# Patient Record
Sex: Male | Born: 1955 | Hispanic: No | Marital: Married | Smoking: Former smoker
Health system: Southern US, Community
[De-identification: ages and names within clinical notes are randomized; demographics above are authoritative.]

## PROBLEM LIST (undated history)

## (undated) DIAGNOSIS — L719 Rosacea, unspecified: Secondary | ICD-10-CM

## (undated) DIAGNOSIS — R011 Cardiac murmur, unspecified: Secondary | ICD-10-CM

## (undated) DIAGNOSIS — Z860100 Personal history of colon polyps, unspecified: Secondary | ICD-10-CM

## (undated) DIAGNOSIS — T7840XA Allergy, unspecified, initial encounter: Secondary | ICD-10-CM

## (undated) DIAGNOSIS — H3322 Serous retinal detachment, left eye: Secondary | ICD-10-CM

## (undated) HISTORY — DX: Serous retinal detachment, left eye: H33.22

## (undated) HISTORY — DX: Cardiac murmur, unspecified: R01.1

## (undated) HISTORY — DX: Personal history of colon polyps, unspecified: Z86.0100

## (undated) HISTORY — DX: Rosacea, unspecified: L71.9

## (undated) HISTORY — DX: Allergy, unspecified, initial encounter: T78.40XA

## (undated) HISTORY — PX: COLONOSCOPY: SHX174

## (undated) HISTORY — DX: Personal history of colonic polyps: Z86.010

---
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
## 1999-01-11 ENCOUNTER — Encounter: Payer: Self-pay | Admitting: Emergency Medicine

## 1999-01-11 ENCOUNTER — Emergency Department (HOSPITAL_COMMUNITY): Admission: EM | Admit: 1999-01-11 | Discharge: 1999-01-11 | Payer: Self-pay | Admitting: Emergency Medicine

## 2004-08-19 ENCOUNTER — Ambulatory Visit: Payer: Self-pay | Admitting: Family Medicine

## 2004-08-22 ENCOUNTER — Ambulatory Visit: Payer: Self-pay | Admitting: Family Medicine

## 2004-09-13 ENCOUNTER — Ambulatory Visit: Payer: Self-pay | Admitting: Gastroenterology

## 2004-09-24 ENCOUNTER — Ambulatory Visit: Payer: Self-pay | Admitting: Gastroenterology

## 2004-09-24 LAB — HM COLONOSCOPY

## 2005-03-04 ENCOUNTER — Encounter: Admission: RE | Admit: 2005-03-04 | Discharge: 2005-03-04 | Payer: Self-pay | Admitting: Family Medicine

## 2005-03-04 ENCOUNTER — Ambulatory Visit: Payer: Self-pay | Admitting: Family Medicine

## 2006-07-18 IMAGING — CR DG OS CALCIS 2+V*L*
3 series · 3 of 3 positions shown · non-contrast
Comparison: None.

CLINICAL DATA: Pain, worst in the morning. 
 CALCANEUS - 2 VIEW:

[view not recorded (1 of 3)]
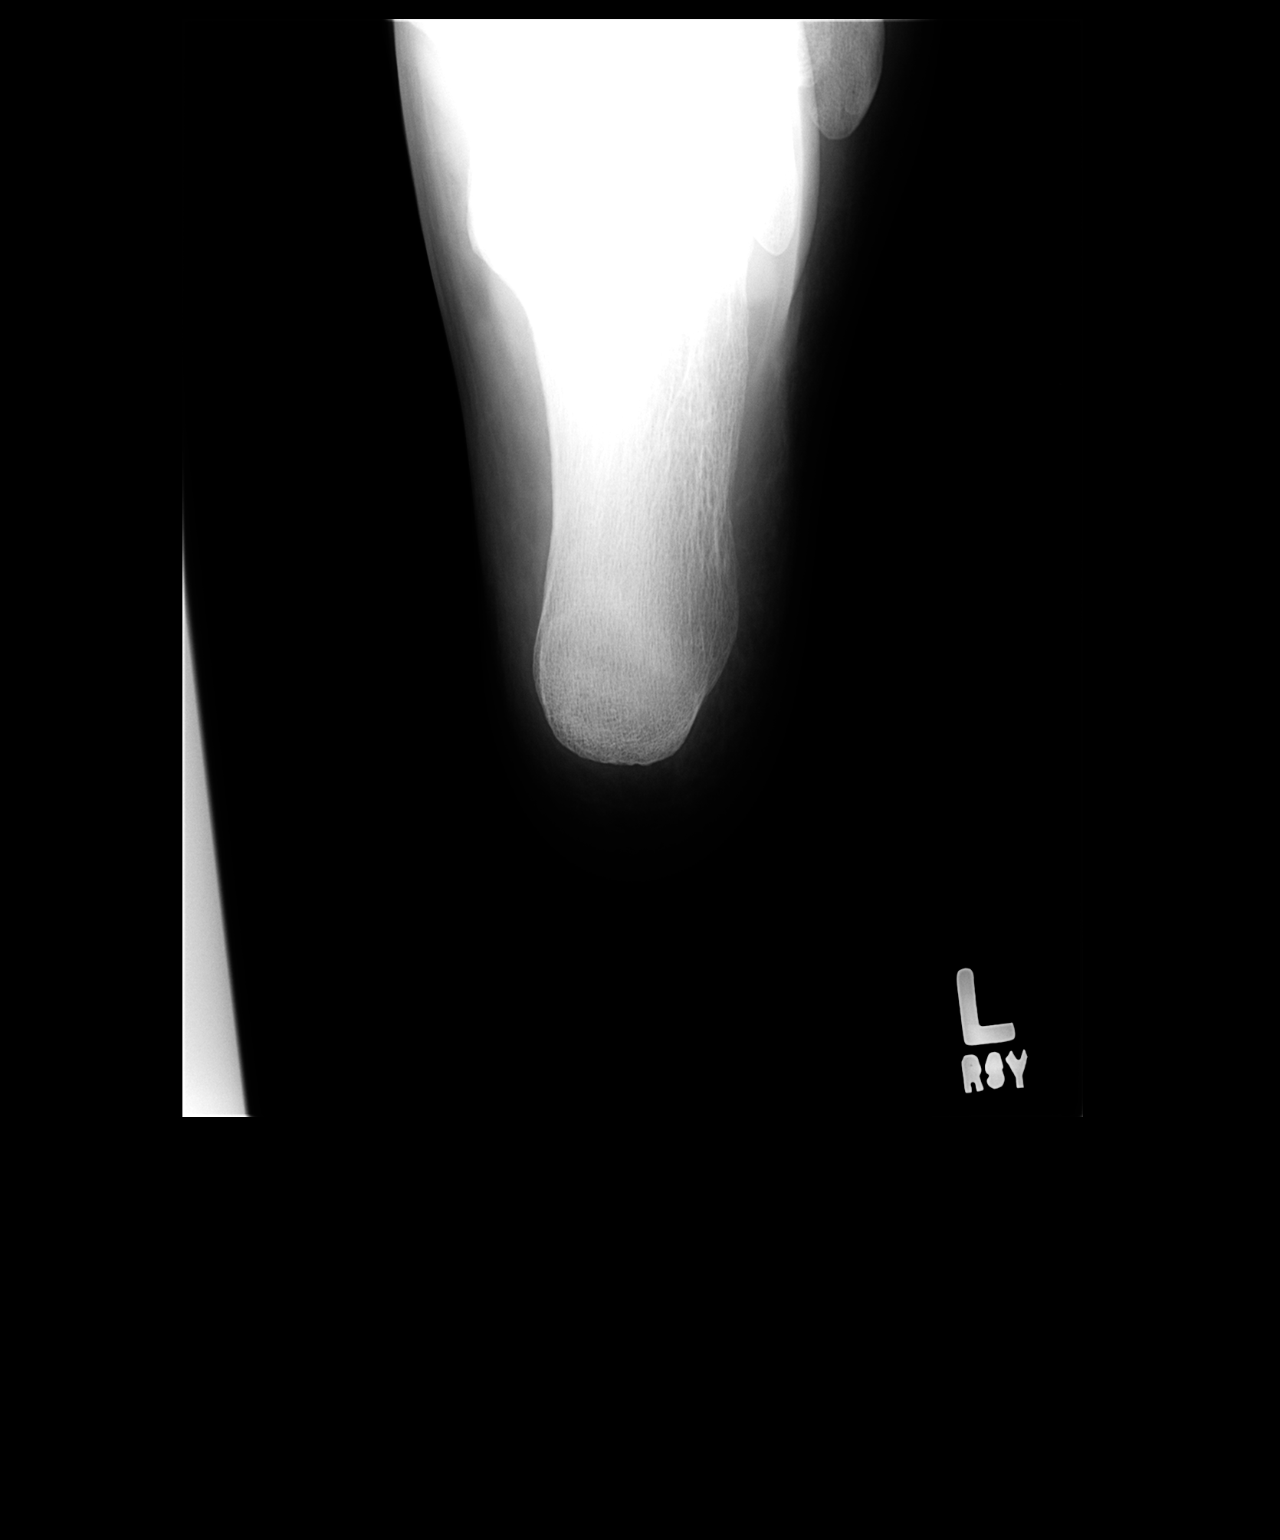

[view not recorded (2 of 3)]
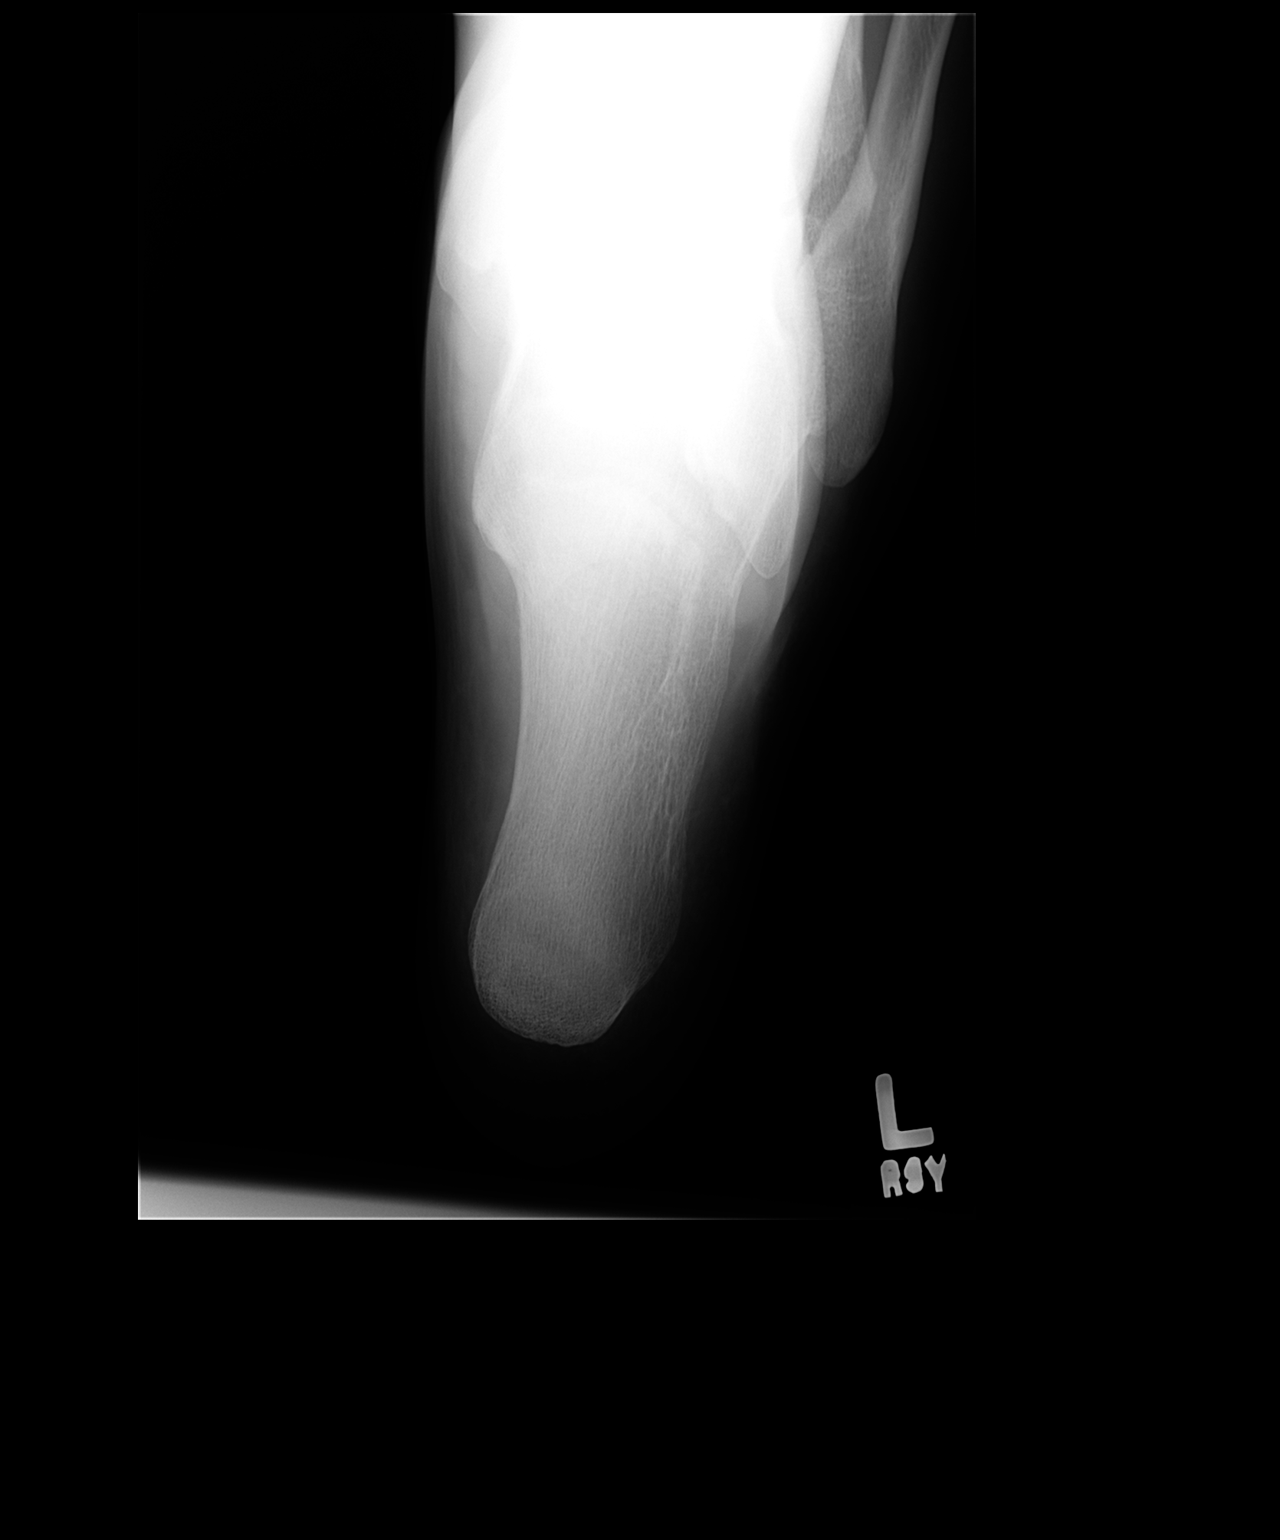

[view not recorded (3 of 3)]
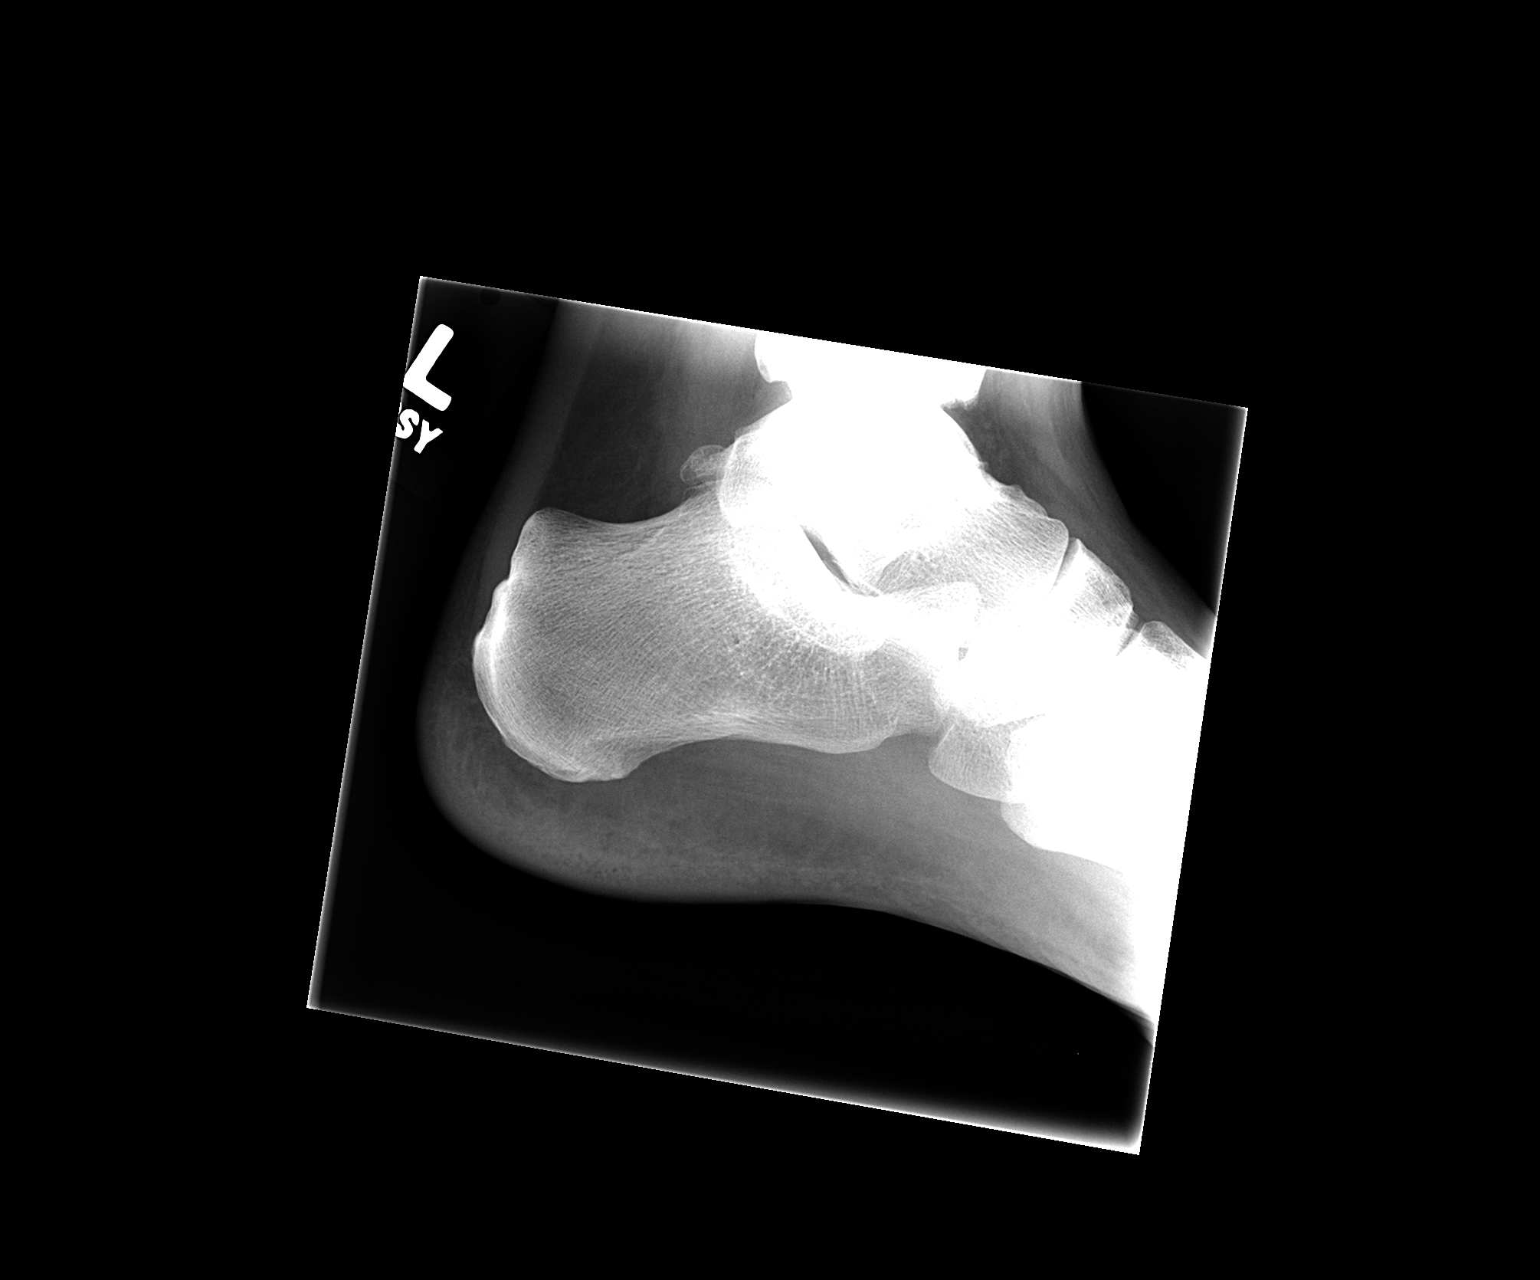

[3 of 3 positions shown; findings below may reference images not displayed]

FINDINGS: No fractures or dislocations are identified.  There are no plantar or calcaneal heel spurs.
IMPRESSION: Normal calcaneus.

## 2007-02-15 ENCOUNTER — Encounter: Payer: Self-pay | Admitting: Family Medicine

## 2007-02-15 DIAGNOSIS — J309 Allergic rhinitis, unspecified: Secondary | ICD-10-CM | POA: Insufficient documentation

## 2007-02-16 ENCOUNTER — Ambulatory Visit: Payer: Self-pay | Admitting: Family Medicine

## 2007-05-14 ENCOUNTER — Encounter: Payer: Self-pay | Admitting: Family Medicine

## 2008-04-13 ENCOUNTER — Ambulatory Visit: Payer: Self-pay | Admitting: Family Medicine

## 2009-05-02 ENCOUNTER — Ambulatory Visit: Payer: Self-pay | Admitting: Family Medicine

## 2009-07-14 LAB — HM COLONOSCOPY

## 2009-09-24 LAB — HM COLONOSCOPY

## 2009-11-15 ENCOUNTER — Encounter: Payer: Self-pay | Admitting: Family Medicine

## 2010-04-23 ENCOUNTER — Ambulatory Visit: Payer: Self-pay | Admitting: Family Medicine

## 2010-04-23 DIAGNOSIS — L2089 Other atopic dermatitis: Secondary | ICD-10-CM | POA: Insufficient documentation

## 2010-04-23 DIAGNOSIS — E663 Overweight: Secondary | ICD-10-CM | POA: Insufficient documentation

## 2010-04-23 DIAGNOSIS — L719 Rosacea, unspecified: Secondary | ICD-10-CM | POA: Insufficient documentation

## 2010-04-23 DIAGNOSIS — L708 Other acne: Secondary | ICD-10-CM | POA: Insufficient documentation

## 2010-04-24 LAB — CONVERTED CEMR LAB
ALT: 30 units/L (ref 0–53)
AST: 22 units/L (ref 0–37)
Albumin: 3.8 g/dL (ref 3.5–5.2)
Alkaline Phosphatase: 81 units/L (ref 39–117)
BUN: 12 mg/dL (ref 6–23)
Basophils Absolute: 0 10*3/uL (ref 0.0–0.1)
Basophils Relative: 0.5 % (ref 0.0–3.0)
Bilirubin, Direct: 0.1 mg/dL (ref 0.0–0.3)
CO2: 30 meq/L (ref 19–32)
Calcium: 9.4 mg/dL (ref 8.4–10.5)
Chloride: 102 meq/L (ref 96–112)
Cholesterol: 148 mg/dL (ref 0–200)
Creatinine, Ser: 0.8 mg/dL (ref 0.4–1.5)
Eosinophils Absolute: 0.2 10*3/uL (ref 0.0–0.7)
Eosinophils Relative: 1.9 % (ref 0.0–5.0)
GFR calc non Af Amer: 103.2 mL/min (ref >60)
Glucose, Bld: 90 mg/dL (ref 70–99)
HCT: 44.2 % (ref 39.0–52.0)
HDL: 38.8 mg/dL — ABNORMAL LOW (ref >39.00)
Hemoglobin: 15.1 g/dL (ref 13.0–17.0)
LDL Cholesterol: 79 mg/dL (ref 0–99)
Lymphocytes Relative: 24.1 % (ref 12.0–46.0)
Lymphs Abs: 2 10*3/uL (ref 0.7–4.0)
MCHC: 34.1 g/dL (ref 30.0–36.0)
MCV: 95.3 fL (ref 78.0–100.0)
Monocytes Absolute: 0.6 10*3/uL (ref 0.1–1.0)
Monocytes Relative: 6.9 % (ref 3.0–12.0)
Neutro Abs: 5.6 10*3/uL (ref 1.4–7.7)
Neutrophils Relative %: 66.6 % (ref 43.0–77.0)
PSA: 0.6 ng/mL (ref 0.10–4.00)
Phosphorus: 3.3 mg/dL (ref 2.3–4.6)
Platelets: 233 10*3/uL (ref 150.0–400.0)
Potassium: 4.2 meq/L (ref 3.5–5.1)
RBC: 4.63 M/uL (ref 4.22–5.81)
RDW: 13.3 % (ref 11.5–14.6)
Sodium: 137 meq/L (ref 135–145)
TSH: 2.5 microintl units/mL (ref 0.35–5.50)
Total Bilirubin: 0.7 mg/dL (ref 0.3–1.2)
Total CHOL/HDL Ratio: 4
Total Protein: 6.9 g/dL (ref 6.0–8.3)
Triglycerides: 152 mg/dL — ABNORMAL HIGH (ref 0.0–149.0)
VLDL: 30.4 mg/dL (ref 0.0–40.0)
WBC: 8.4 10*3/uL (ref 4.5–10.5)

## 2010-05-24 ENCOUNTER — Ambulatory Visit: Payer: Self-pay | Admitting: Family Medicine

## 2010-05-24 DIAGNOSIS — E785 Hyperlipidemia, unspecified: Secondary | ICD-10-CM | POA: Insufficient documentation

## 2010-05-24 DIAGNOSIS — J069 Acute upper respiratory infection, unspecified: Secondary | ICD-10-CM | POA: Insufficient documentation

## 2010-08-02 ENCOUNTER — Ambulatory Visit
Admission: RE | Admit: 2010-08-02 | Discharge: 2010-08-02 | Payer: Self-pay | Source: Home / Self Care | Attending: Family Medicine | Admitting: Family Medicine

## 2010-08-02 DIAGNOSIS — L259 Unspecified contact dermatitis, unspecified cause: Secondary | ICD-10-CM | POA: Insufficient documentation

## 2010-08-13 NOTE — Assessment & Plan Note (Signed)
Summary: 1 MO ROV/MM [facility] APPT TIME/NJR   Vital Signs:  Patient profile:   55 year old male Height:      74 inches (187.96 cm) Weight:      261 pounds (118.64 kg) O2 Sat:      97 % on Room air Pulse rate:   63 / minute BP sitting:   130 / 88  (left arm) Cuff size:   large  Vitals Entered By: Josph Macho RMA (May 24, 2010 8:22 AM)  O2 Flow:  Room air CC: 1 month follow up/ cough w/phlegm (green), chest congestion/ CF Is Patient Diabetic? No   History of Present Illness: Patient is a 55 yo male in today for revaluation of dry, flakey skin. This is much better. He is some flaking but it is significantly improved no itching. He is using it up water based emollient and that's helpful feels the doxycycline has been helpful as well. He is complaining of a one-week history of some congestion and a mild cough productive occasionally of some yellowish sputum. Denies chest pain, palpitations, shortness of breath, GI or GU concerns. Does some slight wheezing which sounds more like a congestion and times. Denies fevers, chills, ear pain, throat pain, headache. Does have some mild nasal congestion but this is unchanged from his baseline and is occasionally productive of a clear  Preventive Screening-Counseling & Management  Alcohol-Tobacco     Smoking Status: quit  Current Medications (verified): 1)  Adprin B 325 Mg  Tabs (Aspirin Buf(Cacarb-Mgcarb-Mgo)) .... Once Daily 2)  Doxycycline Hyclate 100 Mg Tabs (Doxycycline Hyclate) .Marland Kitchen.. 1 Tab By Mouth Once Daily As Needed Rosacea 3)  Triamcinolone Acetonide 0.1 % Crea (Triamcinolone Acetonide) .... Apply To Lesions On Arm/shoulder Two Times A Day As Needed Lesion  Allergies (verified): No Known Drug Allergies  Past History:  Past medical history reviewed for relevance to current acute and chronic problems. Social history (including risk factors) reviewed for relevance to current acute and chronic problems.  Past Medical  History: Reviewed history from 02/15/2007 and no changes required. Allergic rhinitis  Social History: Reviewed history and no changes required. Former Smoker, was 1 ppd quit 30 years ago  Review of Systems      See HPI  Physical Exam  General:  Well-developed,well-nourished,in no acute distress; alert,appropriate and cooperative throughout examination Head:  Normocephalic and atraumatic without obvious abnormalities. No apparent alopecia or balding. Mouth:  Oral mucosa and oropharynx without lesions or exudates.  Teeth in good repair. Neck:  No deformities, masses, or tenderness noted. Lungs:  Normal respiratory effort, chest expands symmetrically. Lungs are clear to auscultation, no crackles or wheezes. Heart:  Normal rate and regular rhythm. S1 and S2 normal without gallop, murmur, click, rub or other extra sounds. Abdomen:  Bowel sounds positive,abdomen soft and non-tender without masses, organomegaly or hernias noted. Extremities:  No clubbing, cyanosis, edema, or deformity noted with normal full range of motion of all joints.   Psych:  Cognition and judgment appear intact. Alert and cooperative with normal attention span and concentration. 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 Once daily Encouraged Mucinex two times a  day and increase fluids and rest  Complete Medication List: 1)  Adprin B 325 Mg Tabs (Aspirin buf(cacarb-mgcarb-mgo)) .... Once daily 2)  Doxycycline Hyclate 100 Mg Tabs (Doxycycline hyclate) .Marland Kitchen.. 1 tab by mouth once daily as needed rosacea 3)  Triamcinolone  Acetonide 0.1 % Crea (Triamcinolone acetonide) .... Apply to lesions on arm/shoulder two times a day as needed lesion  Patient Instructions: 1)  Please schedule a follow-up appointment as needed or in 3-5 months for reeval. Consider Dr Scotty Court or Dr Marguarite Arbour. 2)  Call with any concerns 3)  Mucinex ER 600mg  tabs 1 tab by mouth two times a day x 10 days Prescriptions: DOXYCYCLINE HYCLATE 100 MG TABS (DOXYCYCLINE HYCLATE) 1 tab by mouth once daily as needed rosacea  #30 x 3   Entered and Authorized by:   Danise Edge MD   Signed by:   Danise Edge MD on 05/24/2010   Method used:   [facility]  [address] [id number]       [address]       [place], [place]  [address]       Ph: [phone] or [phone]       Fax: [phone]   RxID:   [id number]    Orders Added: 1)  Est. Patient Level IV [[address]]

## 2010-08-13 NOTE — Letter (Signed)
Summary: [facility]  [facility]   Imported By: Maryln Gottron 11/26/2009 13:54:02  _____________________________________________________________________  External Attachment:    Type:   Image     Comment:   External Document

## 2010-08-13 NOTE — Assessment & Plan Note (Signed)
Summary: SKIN FLAKING AROUND EARS AND NOSE/FLU SHOT//SLM   Vital Signs:  Patient profile:   56 year old male Height:      74 inches (187.96 cm) Weight:      251 pounds (114.09 kg) BMI:     32.34 O2 Sat:      97 % on Room air Temp:     97.9 degrees F (36.61 degrees C) oral Pulse rate:   69 / minute BP sitting:   122 / 80  (left arm) Cuff size:   large  Vitals Entered By: Josph Macho RMA (April 23, 2010 10:20 AM)  O2 Flow:  Room air CC: Skin flaking around ears and nose X6-8 months off and on/ flu shot today/ CF Is Patient Diabetic? No   History of Present Illness: Patient in today for evaluation of multiple skin concerns. He has had redness/swelling/flaking in skin over the past several months. Denies any change in products. Has also had trouble off and on for years with small, circular scaly lesions on right arm appears in different places. Never bloody or painful. Patient also noting new onset acne on his back, erythematous. No recent illness/CP/palp/SOB/f/c/GI or GU c/o.  Current Problems (verified): 1)  Rosacea  (ICD-695.3) 2)  Preventive Health Care  (ICD-V70.0) 3)  Allergic Rhinitis  (ICD-477.9)  Current Medications (verified): 1)  Adprin B 325 Mg  Tabs (Aspirin Buf(Cacarb-Mgcarb-Mgo)) .... Once Daily  Allergies (verified): No Known Drug Allergies  Past History:  Past medical, surgical, family and social histories (including risk factors) reviewed, and no changes noted (except as noted below).  Past Medical History: Reviewed history from 02/15/2007 and no changes required. Allergic rhinitis  Past Surgical History: Reviewed history from 02/15/2007 and no changes required. Denies surgical history  Family History: Reviewed history and no changes required.  Social History: Reviewed history and no changes required.  Review of Systems  The patient denies anorexia, fever, weight loss, weight gain, vision loss, decreased hearing, hoarseness, chest pain,  syncope, dyspnea on exertion, peripheral edema, prolonged cough, headaches, hemoptysis, abdominal pain, melena, hematochezia, severe indigestion/heartburn, hematuria, incontinence, muscle weakness, suspicious skin lesions, transient blindness, difficulty walking, depression, unusual weight change, abnormal bleeding, and enlarged lymph nodes.         Flu Vaccine Consent Questions     Do you have a history of severe allergic reactions to this vaccine? no    Any prior history of allergic reactions to egg and/or gelatin? no    Do you have a sensitivity to the preservative Thimersol? no    Do you have a past history of Guillan-Barre Syndrome? no    Do you currently have an acute febrile illness? no    Have you ever had a severe reaction to latex? no    Vaccine information given and explained to patient? yes    Are you currently pregnant? no    Lot Number:[id number]   Exp Date:01/11/2011   Site Given  Left Deltoid IM Josph Macho RMA  April 23, 2010 10:22 AM   Physical Exam  General:  Well-developed,well-nourished,in no acute distress; alert,appropriate and cooperative throughout examination Eyes:  No corneal or conjunctival inflammation noted. EOMI. Perrla. Funduscopic exam benign, without hemorrhages, exudates or papilledema. Vision grossly normal. Ears:  External ear exam shows no significant lesions or deformities.  Otoscopic examination reveals clear canals, tympanic membranes are intact bilaterally without bulging, retraction, inflammation or discharge. Hearing is grossly normal bilaterally. Nose:  External nasal examination shows no deformity or inflammation. Nasal mucosa are  pink and moist without lesions or exudates. Mouth:  Oral mucosa and oropharynx without lesions or exudates.  Teeth in good repair. Neck:  No deformities, masses, or tenderness noted. Lungs:  Normal respiratory effort, chest expands symmetrically. Lungs are clear to auscultation, no crackles or wheezes. Heart:   Normal rate and regular rhythm. S1 and S2 normal without gallop, murmur, click, rub or other extra sounds. Abdomen:  Bowel sounds positive,abdomen soft and non-tender without masses, organomegaly or hernias noted. Obese Msk:  No deformity or scoliosis noted of thoracic or lumbar spine.   Pulses:  R and L carotid,radial,femoral,dorsalis pedis and posterior tibial pulses are full and equal bilaterally Extremities:  No clubbing, cyanosis, edema, or deformity noted with normal full range of motion of all joints.   Neurologic:  No cranial nerve deficits noted. Station and gait are normal. Plantar reflexes are down-going bilaterally. DTRs are symmetrical throughout. Sensory, motor and coordinative functions appear intact. Skin:  Erythema, inflammation, scaly/flaking skin noted across face, oily. Dry, scaly, circular patches on right arm and top of right shoulder, mild erythema at base. Maculopapular acne on the back Cervical Nodes:  No lymphadenopathy noted Psych:  Cognition and judgment appear intact. Alert and cooperative with normal attention span and concentration. No apparent delusions, illusions, hallucinations   Impression & Recommendations:  Problem # 1:  ROSACEA (ICD-695.3) Doxycyline, Neutrogena T gel, increase hydration add fish oil caps  Problem # 2:  DERMATITIS, ATOPIC (ICD-691.8)  His updated medication list for this problem includes:    Triamcinolone Acetonide 0.1 % Crea (Triamcinolone acetonide) .Marland Kitchen... Apply to lesions on arm/shoulder two times a day as needed lesion  Problem # 3:  OTHER ACNE (ICD-706.1)  The following medications were removed from the medication list:    Azithromycin 250 Mg Tabs (Azithromycin) .Marland Kitchen... Take 2 now then 1 once daily His updated medication list for this problem includes:    Doxycycline Hyclate 100 Mg Tabs (Doxycycline hyclate) .Marland Kitchen... 1 tab by mouth once daily as needed rosacea Cetaphil soap  Problem # 4:  OVERWEIGHT (ICD-278.02) Fasting labs checked  today and reevaluate at next visit  Complete Medication List: 1)  Adprin B 325 Mg Tabs (Aspirin buf(cacarb-mgcarb-mgo)) .... Once daily 2)  Doxycycline Hyclate 100 Mg Tabs (Doxycycline hyclate) .Marland Kitchen.. 1 tab by mouth once daily as needed rosacea 3)  Triamcinolone Acetonide 0.1 % Crea (Triamcinolone acetonide) .... Apply to lesions on arm/shoulder two times a day as needed lesion  Other Orders: Admin 1st Vaccine ([address]) Flu Vaccine 34yrs + ([address]) TLB-Lipid Panel (80061-LIPID) TLB-Renal Function Panel (80069-RENAL) TLB-CBC Platelet - w/Differential (85025-CBCD) TLB-Hepatic/Liver Function Pnl (80076-HEPATIC) TLB-TSH (Thyroid Stimulating Hormone) (84443-TSH) TLB-PSA (Prostate Specific Antigen) (84153-PSA) Venipuncture (09811) Specimen Handling (91478)  Patient Instructions: 1)  Please schedule a follow-up appointment in 1 to 2  month.  2)  Use Neutorgena T gel shampoo over head an neck 2-3 x weekly 3)  Use Cetaphil soap as your body soap daily 4)  Start Fish oil 1 cap daily 5)  Take a probiotic such as Align caps daily or a yogurt a day while on antibiotics Prescriptions: TRIAMCINOLONE ACETONIDE 0.1 % CREA (TRIAMCINOLONE ACETONIDE) apply to lesions on arm/shoulder two times a day as needed lesion  #60 gm x 1   Entered and Authorized by:   Danise Edge MD   Signed by:   Danise Edge MD on 04/23/2010   Method used:   [facility]  [address] [phone][facility]ail[facility]  [address]       [place], [place]  [address]       Ph: [phone] or [phone]       Fax: [phone]   RxID:   [id number] DOXYCYCLINE HYCLATE 100 MG TABS (DOXYCYCLINE HYCLATE) 1 tab by mouth once daily as needed rosacea  #30 x 2   Entered and Authorized by:   Danise Edge MD   Signed by:   Danise Edge MD on 04/23/2010   Method used:   [facility]  [address] [id number]       [address]       [place], [place]  [address]        Ph: [phone] or [phone]       Fax: [phone]   RxID:   [id number]

## 2010-08-15 NOTE — Assessment & Plan Note (Signed)
Summary: hives/njr   Vital Signs:  Patient profile:   55 year old male Temp:     98.3 degrees F oral BP sitting:   130 / 90  (left arm) Cuff size:   large  Vitals Entered By: Sid Falcon LPN (August 02, 2010 1:24 PM)  History of Present Illness: Patient seen with rash predominantly upper back.  Last August diagnosed with rosacea and started on doxycycline. Shortly after starting that he noticed some pruritic erythematous papules on his upper back which he described as hives. He has some dry scaly skin changes which are chronic and involving beard area mostly.. Frequent excoriated this area. Also using triamcinolone cream sporadically. No improvement.  Facial rash has improved somewhat but worsening rash upper back. Uses gentle moisturizing soap  Allergies (verified): No Known Drug Allergies  Past History:  Past Medical History: Last updated: 02/15/2007 Allergic rhinitis  Past Surgical History: Last updated: 02/15/2007 Denies surgical history  Social History: Last updated: 05/24/2010 Former Smoker, was 1 ppd quit 30 years ago  Risk Factors: Smoking Status: quit (05/24/2010) PMH-FH-SH reviewed for relevance  Review of Systems  The patient denies fever, weight loss, hematuria, and enlarged lymph nodes.    Physical Exam  General:  Well-developed,well-nourished,in no acute distress; alert,appropriate and cooperative throughout examination Head:  normocephalic and atraumatic.   Mouth:  Oral mucosa and oropharynx without lesions or exudates.  Teeth in good repair. Neck:  No deformities, masses, or tenderness noted. Lungs:  Normal respiratory effort, chest expands symmetrically. Lungs are clear to auscultation, no crackles or wheezes. Heart:  normal rate and regular rhythm.   Skin:  multiple excoriations upper back. Erythematous papules. Some dry scaly changes. No pustules. No vesicles.  On face beard region dry erythematous rash with topical scaling. Cervical Nodes:   No lymphadenopathy noted   Impression & Recommendations:  Problem # 1:  DERMATITIS (ICD-692.9)  patient has significant pruritic rash upper back with significant dry skin changes. I think mostly this represents nonspecific dermatitis with very dry skin.   Depo-Medrol 80 mg. Over-the-counter antihistamine. Combination of topical steroid with moisturizing lotion ?of doxycycline related rash.  Will stop that for now. His updated medication list for this problem includes:    Triamcinolone Acetonide 0.1 % Crea (Triamcinolone acetonide) .Marland Kitchen... Apply to lesions on arm/shoulder two times a day as needed lesion  Orders: Depo- Medrol 80mg  (J1040) Admin of Therapeutic Inj  intramuscular or subcutaneous (04540)  Complete Medication List: 1)  Adprin B 325 Mg Tabs (Aspirin buf(cacarb-mgcarb-mgo)) .... Once daily 2)  Doxycycline Hyclate 100 Mg Tabs (Doxycycline hyclate) .Marland Kitchen.. 1 tab by mouth once daily as needed rosacea 3)  Triamcinolone Acetonide 0.1 % Crea (Triamcinolone acetonide) .... Apply to lesions on arm/shoulder two times a day as needed lesion  Patient Instructions: 1)  Discontinue doxycycline  2)  Start over-the-counter antihistamine such as Zyrtec or Allegra one daily 3)  Use moisturizing lotions such as Eucerin or Cetaphil 4)  Continue topical triamcinolone cream 5)  Avoid prolonged bathing   Medication Administration  Injection # 1:    Medication: Depo- Medrol 80mg     Diagnosis: DERMATITIS (ICD-692.9)    Route: IM    Site: RUOQ gluteus    Exp Date: 01/11/2013    Lot #: [id number]    Mfr: Pharmacia    Patient tolerated injection without complications    Given by: Sid Falcon LPN (August 02, 2010 1:59 PM)  Orders Added: 1)  Depo- Medrol 80mg  [J1040] 2)  Admin of Therapeutic Inj  intramuscular or subcutaneous [96372] 3)  Est. Patient Level III [81191]

## 2011-06-10 ENCOUNTER — Ambulatory Visit (INDEPENDENT_AMBULATORY_CARE_PROVIDER_SITE_OTHER): Payer: 59

## 2011-06-10 DIAGNOSIS — Z23 Encounter for immunization: Secondary | ICD-10-CM

## 2011-07-10 ENCOUNTER — Encounter: Payer: Self-pay | Admitting: Family Medicine

## 2011-07-10 ENCOUNTER — Ambulatory Visit (INDEPENDENT_AMBULATORY_CARE_PROVIDER_SITE_OTHER): Payer: 59 | Admitting: Family Medicine

## 2011-07-10 VITALS — BP 106/64 | HR 69 | Temp 98.1°F | Ht 74.0 in | Wt 239.0 lb

## 2011-07-10 DIAGNOSIS — B351 Tinea unguium: Secondary | ICD-10-CM

## 2011-07-10 DIAGNOSIS — Z23 Encounter for immunization: Secondary | ICD-10-CM

## 2011-07-10 DIAGNOSIS — L719 Rosacea, unspecified: Secondary | ICD-10-CM

## 2011-07-10 NOTE — Progress Notes (Signed)
New pt to est care.   Rosacea.  Doing well with prn use of doxy.  occ facial sx.  No ADE from the med.   Nail fungus for 5-6 years, worst on R 1st toe.  Asking about options.    Meds, vitals, and allergies reviewed.   ROS: See HPI.  Otherwise, noncontributory.  GEN: nad, alert and oriented HEENT: mucous membranes moist NECK: supple w/o LA CV: rrr PULM: ctab, no inc wob ABD: soft, +bs EXT: no edema SKIN: no acute rash but mild changes from rosacea noted on face R1st toe with onychomycosis

## 2011-07-10 NOTE — Patient Instructions (Signed)
I would get a physical in about 1 year.  Take care.  Let me know if you have concerns.

## 2011-07-11 ENCOUNTER — Encounter: Payer: Self-pay | Admitting: Family Medicine

## 2011-07-11 DIAGNOSIS — B351 Tinea unguium: Secondary | ICD-10-CM | POA: Insufficient documentation

## 2011-07-11 NOTE — Assessment & Plan Note (Signed)
Continue prn use of doxy, f/u prn.

## 2011-07-11 NOTE — Assessment & Plan Note (Signed)
D/w pt but given the potential side effects, I wouldn't use lamisil.  He agreed.

## 2011-07-28 ENCOUNTER — Encounter: Payer: Self-pay | Admitting: *Deleted

## 2011-08-19 ENCOUNTER — Encounter: Payer: Self-pay | Admitting: Family Medicine

## 2011-08-27 ENCOUNTER — Encounter: Payer: Self-pay | Admitting: Gastroenterology

## 2014-10-02 ENCOUNTER — Telehealth: Payer: Self-pay

## 2014-10-02 NOTE — Telephone Encounter (Signed)
Pt called back and Amber advised pt on how to come to office to sign for record release.

## 2014-10-02 NOTE — Telephone Encounter (Signed)
Pt left /vm requesting cb about getting medical records.left v/m for pt to cb.

## 2014-10-24 ENCOUNTER — Encounter: Payer: Self-pay | Admitting: Gastroenterology

## 2015-02-21 ENCOUNTER — Encounter: Payer: Self-pay | Admitting: Gastroenterology

## 2015-02-27 ENCOUNTER — Ambulatory Visit: Payer: Self-pay | Admitting: Family Medicine

## 2015-07-25 ENCOUNTER — Encounter: Payer: Self-pay | Admitting: Gastroenterology

## 2015-09-24 ENCOUNTER — Ambulatory Visit (AMBULATORY_SURGERY_CENTER): Payer: Self-pay | Admitting: *Deleted

## 2015-09-24 VITALS — Ht 74.0 in | Wt 242.0 lb

## 2015-09-24 DIAGNOSIS — Z1211 Encounter for screening for malignant neoplasm of colon: Secondary | ICD-10-CM

## 2015-09-24 MED ORDER — NA SULFATE-K SULFATE-MG SULF 17.5-3.13-1.6 GM/177ML PO SOLN: ORAL | Status: DC

## 2015-09-24 NOTE — Progress Notes (Signed)
Patient denies any allergies to eggs or soy. Patient denies any problems with anesthesia/sedation. Patient denies any oxygen use at home and does not take any diet/weight loss medications.  

## 2015-10-08 ENCOUNTER — Encounter: Payer: Self-pay | Admitting: Gastroenterology

## 2015-10-08 ENCOUNTER — Ambulatory Visit (AMBULATORY_SURGERY_CENTER): Payer: 59 | Admitting: Gastroenterology

## 2015-10-08 VITALS — BP 102/66 | HR 73 | Temp 98.6°F | Resp 10 | Ht 74.0 in | Wt 242.0 lb

## 2015-10-08 DIAGNOSIS — Z1211 Encounter for screening for malignant neoplasm of colon: Secondary | ICD-10-CM | POA: Diagnosis present

## 2015-10-08 DIAGNOSIS — D124 Benign neoplasm of descending colon: Secondary | ICD-10-CM | POA: Diagnosis not present

## 2015-10-08 DIAGNOSIS — D125 Benign neoplasm of sigmoid colon: Secondary | ICD-10-CM | POA: Diagnosis not present

## 2015-10-08 MED ORDER — SODIUM CHLORIDE 0.9 % IV SOLN: 500.0000 mL | INTRAVENOUS | Status: DC

## 2015-10-08 NOTE — Progress Notes (Signed)
Called to room to assist during endoscopic procedure.  Patient ID and intended procedure confirmed with present staff. Received instructions for my participation in the procedure from the performing physician.  

## 2015-10-08 NOTE — Patient Instructions (Signed)
YOU HAD AN ENDOSCOPIC PROCEDURE TODAY AT [facility]:   Refer to the procedure report that was given to you for any specific questions about what was found during the examination.  If the procedure report does not answer your questions, please call your gastroenterologist to clarify.  If you requested that your care partner not be given the details of your procedure findings, then the procedure report has been included in a sealed envelope for you to review at your convenience later.  YOU SHOULD EXPECT: Some feelings of bloating in the abdomen. Passage of more gas than usual.  Walking can help get rid of the air that was put into your GI tract during the procedure and reduce the bloating. If you had a lower endoscopy (such as a colonoscopy or flexible sigmoidoscopy) you may notice spotting of blood in your stool or on the toilet paper. If you underwent a bowel prep for your procedure, you may not have a normal bowel movement for a few days.  Please Note:  You might notice some irritation and congestion in your nose or some drainage.  This is from the oxygen used during your procedure.  There is no need for concern and it should clear up in a day or so.  SYMPTOMS TO REPORT IMMEDIATELY:   Following lower endoscopy (colonoscopy or flexible sigmoidoscopy):  Excessive amounts of blood in the stool  Significant tenderness or worsening of abdominal pains  Swelling of the abdomen that is new, acute  Fever of 100F or higher   For urgent or emergent issues, a gastroenterologist can be reached at any hour by calling [phone].   DIET: Your first meal following the procedure should be a small meal and then it is ok to progress to your normal diet. Heavy or fried foods are harder to digest and may make you feel nauseous or bloated.  Likewise, meals heavy in dairy and vegetables can increase bloating.  Drink plenty of fluids but you should avoid alcoholic beverages for 24  hours.  ACTIVITY:  You should plan to take it easy for the rest of today and you should NOT DRIVE or use heavy machinery until tomorrow (because of the sedation medicines used during the test).    FOLLOW UP: Our staff will call the number listed on your records the next business day following your procedure to check on you and address any questions or concerns that you may have regarding the information given to you following your procedure. If we do not reach you, we will leave a message.  However, if you are feeling well and you are not experiencing any problems, there is no need to return our call.  We will assume that you have returned to your regular daily activities without incident.  If any biopsies were taken you will be contacted by phone or by letter within the next 1-3 weeks.  Please call us at [phone] if you have not heard about the biopsies in 3 weeks.    SIGNATURES/CONFIDENTIALITY: You and/or your care partner have signed paperwork which will be entered into your electronic medical record.  These signatures attest to the fact that that the information above on your After Visit Summary has been reviewed and is understood.  Full responsibility of the confidentiality of this discharge information lies with you and/or your care-partner.  Diverticulosis,high fiber diet, polyps-handouts given  Hold aspirin for 10 days.

## 2015-10-08 NOTE — Op Note (Signed)
[facility] Patient Name: David Moody Procedure Date: 10/08/2015 8:43 AM MRN: [id number] Endoscopist: Milus Banister , MD Age: 60 Referring MD:  Date of Birth: Aug 16, 1955 Gender: Male Procedure:                Colonoscopy Indications:              Screening for colorectal malignant neoplasm                            (colonoscopy 2006 Dr. Velora Heckler, no polyps) Medicines:                Monitored Anesthesia Care Procedure:                Pre-Anesthesia Assessment:                           - Prior to the procedure, a History and Physical                            was performed, and patient medications and                            allergies were reviewed. The patient's tolerance of                            previous anesthesia was also reviewed. The risks                            and benefits of the procedure and the sedation                            options and risks were discussed with the patient.                            All questions were answered, and informed consent                            was obtained. Prior Anticoagulants: The patient has                            taken no previous anticoagulant or antiplatelet                            agents. ASA Grade Assessment: II - A patient with                            mild systemic disease. After reviewing the risks                            and benefits, the patient was deemed in                            satisfactory condition to undergo the procedure.  After obtaining informed consent, the colonoscope                            was passed under direct vision. Throughout the                            procedure, the patient's blood pressure, pulse, and                            oxygen saturations were monitored continuously. The                            Model CF-HQ190L [id number]) scope was introduced                            through the anus and advanced to the the cecum,                             identified by appendiceal orifice and ileocecal                            valve. The colonoscopy was performed without                            difficulty. The patient tolerated the procedure                            well. The quality of the bowel preparation was                            excellent. The ileocecal valve, appendiceal                            orifice, and rectum were photographed. Scope In: 8:57:05 AM Scope Out: 9:09:54 AM Scope Withdrawal Time: 0 hours 11 minutes 9 seconds  Total Procedure Duration: 0 hours 12 minutes 49 seconds  Findings:      A 5 mm polyp was found in the sigmoid colon. The polyp was sessile. The       polyp was removed with a cold snare. Resection and retrieval were       complete.      Two pedunculated polyps were found in the sigmoid colon and descending       colon. The polyps were 11 to 18 mm in size. These polyps were removed       with a hot snare. Resection and retrieval were complete.      Many small and large-mouthed diverticula were found in the left colon.      The exam was otherwise without abnormality on direct and retroflexion       views. Complications:            No immediate complications. Estimated Blood Loss:     Estimated blood loss: none. Impression:               - One 5 mm polyp in the sigmoid colon, removed with  a cold snare. Resected and retrieved.                           - Two 11 to 18 mm polyps in the sigmoid colon and                            in the descending colon, removed with a hot snare.                            Resected and retrieved.                           - Diverticulosis in the left colon.                           - The examination was otherwise normal on direct                            and retroflexion views. Recommendation:           - Patient has a contact number available for                            emergencies. The signs and symptoms  of potential                            delayed complications were discussed with the                            patient. Return to normal activities tomorrow.                            Written discharge instructions were provided to the                            patient.                           - Resume previous diet.                           - Continue present medications.                           You will receive a letter within 2-3 weeks with the                            pathology results and my final recommendations.                           If the polyp(s) is proven to be 'pre-cancerous' on                            pathology, you will need repeat colonoscopy in 3  years. If the polyp(s) is NOT 'precancerous' on                            pathology then you should repeat colon cancer                            screening in 10 years with colonoscopy without need                            for colon cancer screening by any method prior to                            then (including stool testing). Procedure Code(s):        --- Professional ---                           [phone], Colonoscopy, flexible; with removal of                            tumor(s), polyp(s), or other lesion(s) by snare                            technique CPT copyright 2016 American Medical Association. All rights reserved. Milus Banister, MD 10/08/2015 9:13:50 AM This report has been signed electronically. Number of Addenda: 0 Referring MD:      London Pepper

## 2015-10-08 NOTE — Progress Notes (Signed)
To PACU Pt awake and Alert report to RN

## 2015-10-09 ENCOUNTER — Telehealth: Payer: Self-pay

## 2015-10-09 NOTE — Telephone Encounter (Signed)
  Follow up Call-  Call back number 10/08/2015  Post procedure Call Back phone  # [phone]  Permission to leave phone message Yes     Patient questions:  Do you have a fever, pain , or abdominal swelling? No. Pain Score  0 *  Have you tolerated food without any problems? Yes.    Have you been able to return to your normal activities? Yes.    Do you have any questions about your discharge instructions: Diet   No. Medications  No. Follow up visit  No.  Do you have questions or concerns about your Care? No.  Actions: * If pain score is 4 or above: No action needed, pain <4.

## 2015-10-11 ENCOUNTER — Encounter: Payer: Self-pay | Admitting: Gastroenterology

## 2016-09-10 DIAGNOSIS — H2512 Age-related nuclear cataract, left eye: Secondary | ICD-10-CM | POA: Diagnosis not present

## 2016-09-10 DIAGNOSIS — H4423 Degenerative myopia, bilateral: Secondary | ICD-10-CM | POA: Diagnosis not present

## 2016-09-10 DIAGNOSIS — H2513 Age-related nuclear cataract, bilateral: Secondary | ICD-10-CM | POA: Diagnosis not present

## 2016-09-25 ENCOUNTER — Encounter (INDEPENDENT_AMBULATORY_CARE_PROVIDER_SITE_OTHER): Payer: 59 | Admitting: Ophthalmology

## 2016-09-25 DIAGNOSIS — H2513 Age-related nuclear cataract, bilateral: Secondary | ICD-10-CM | POA: Diagnosis not present

## 2016-09-25 DIAGNOSIS — H5213 Myopia, bilateral: Secondary | ICD-10-CM

## 2016-09-25 DIAGNOSIS — H43813 Vitreous degeneration, bilateral: Secondary | ICD-10-CM

## 2016-09-25 DIAGNOSIS — H33301 Unspecified retinal break, right eye: Secondary | ICD-10-CM

## 2016-10-03 ENCOUNTER — Ambulatory Visit (INDEPENDENT_AMBULATORY_CARE_PROVIDER_SITE_OTHER): Payer: 59 | Admitting: Ophthalmology

## 2016-10-03 DIAGNOSIS — H33301 Unspecified retinal break, right eye: Secondary | ICD-10-CM

## 2016-11-05 DIAGNOSIS — Z13220 Encounter for screening for lipoid disorders: Secondary | ICD-10-CM | POA: Diagnosis not present

## 2016-11-05 DIAGNOSIS — Z0000 Encounter for general adult medical examination without abnormal findings: Secondary | ICD-10-CM | POA: Diagnosis not present

## 2016-12-24 DIAGNOSIS — H16223 Keratoconjunctivitis sicca, not specified as Sjogren's, bilateral: Secondary | ICD-10-CM | POA: Diagnosis not present

## 2017-01-05 ENCOUNTER — Ambulatory Visit (INDEPENDENT_AMBULATORY_CARE_PROVIDER_SITE_OTHER): Payer: 59 | Admitting: Ophthalmology

## 2017-01-05 DIAGNOSIS — H33301 Unspecified retinal break, right eye: Secondary | ICD-10-CM

## 2017-01-05 DIAGNOSIS — H43813 Vitreous degeneration, bilateral: Secondary | ICD-10-CM

## 2017-02-10 DIAGNOSIS — H2512 Age-related nuclear cataract, left eye: Secondary | ICD-10-CM | POA: Diagnosis not present

## 2017-02-11 DIAGNOSIS — H2511 Age-related nuclear cataract, right eye: Secondary | ICD-10-CM | POA: Diagnosis not present

## 2017-02-17 DIAGNOSIS — H2511 Age-related nuclear cataract, right eye: Secondary | ICD-10-CM | POA: Diagnosis not present

## 2017-05-04 DIAGNOSIS — Z23 Encounter for immunization: Secondary | ICD-10-CM | POA: Diagnosis not present

## 2017-07-15 DIAGNOSIS — J209 Acute bronchitis, unspecified: Secondary | ICD-10-CM | POA: Diagnosis not present

## 2017-10-06 DIAGNOSIS — H10502 Unspecified blepharoconjunctivitis, left eye: Secondary | ICD-10-CM | POA: Diagnosis not present

## 2018-05-07 DIAGNOSIS — Z23 Encounter for immunization: Secondary | ICD-10-CM | POA: Diagnosis not present

## 2018-09-30 ENCOUNTER — Encounter: Payer: Self-pay | Admitting: Gastroenterology

## 2018-12-28 ENCOUNTER — Encounter (INDEPENDENT_AMBULATORY_CARE_PROVIDER_SITE_OTHER): Payer: 59 | Admitting: Ophthalmology

## 2018-12-28 ENCOUNTER — Other Ambulatory Visit: Payer: Self-pay

## 2018-12-28 DIAGNOSIS — H33301 Unspecified retinal break, right eye: Secondary | ICD-10-CM | POA: Diagnosis not present

## 2018-12-28 DIAGNOSIS — H43813 Vitreous degeneration, bilateral: Secondary | ICD-10-CM | POA: Diagnosis not present

## 2018-12-28 DIAGNOSIS — H338 Other retinal detachments: Secondary | ICD-10-CM

## 2019-10-20 ENCOUNTER — Encounter: Payer: Self-pay | Admitting: Gastroenterology

## 2019-11-23 ENCOUNTER — Other Ambulatory Visit: Payer: Self-pay

## 2019-11-23 ENCOUNTER — Ambulatory Visit (AMBULATORY_SURGERY_CENTER): Payer: Self-pay | Admitting: *Deleted

## 2019-11-23 VITALS — Temp 97.7°F | Ht 74.0 in | Wt 228.6 lb

## 2019-11-23 DIAGNOSIS — Z86010 Personal history of colonic polyps: Secondary | ICD-10-CM

## 2019-11-23 NOTE — Progress Notes (Signed)
1015- pt's [facility] Medicare card was in a chair in our lobby.  Another pt gave it to me. I called pt to let him know and he asked that I mail card to him.  Address verified and card mailed.

## 2019-11-23 NOTE — Progress Notes (Signed)
Pt had second dose of covid vaccine 08-27-19  Pt is aware that care partner will wait in the car during procedure; if they feel like they will be too hot or cold to wait in the car; they may wait in the 4 th floor lobby. Patient is aware to bring only one care partner. We want them to wear a mask (we do not have any that we can provide them), practice social distancing, and we will check their temperatures when they get here.  I did remind the patient that their care partner needs to stay in the parking lot the entire time and have a cell phone available, we will call them when the pt is ready for discharge. Patient will wear mask into building.   No egg or soy allergy  No home oxygen use   No medications for weight loss taken  emmi information given  Pt denies constipation issues  No trouble with sedation, moving neck, or fam hx/hx of malignant hyperthermia

## 2019-11-28 ENCOUNTER — Encounter: Payer: Self-pay | Admitting: Gastroenterology

## 2019-12-07 ENCOUNTER — Other Ambulatory Visit: Payer: Self-pay

## 2019-12-07 ENCOUNTER — Ambulatory Visit (AMBULATORY_SURGERY_CENTER): Payer: Medicare Other | Admitting: Gastroenterology

## 2019-12-07 ENCOUNTER — Encounter: Payer: Self-pay | Admitting: Gastroenterology

## 2019-12-07 VITALS — BP 99/61 | HR 68 | Temp 96.9°F | Resp 12 | Ht 74.0 in | Wt 228.6 lb

## 2019-12-07 DIAGNOSIS — D125 Benign neoplasm of sigmoid colon: Secondary | ICD-10-CM

## 2019-12-07 DIAGNOSIS — D120 Benign neoplasm of cecum: Secondary | ICD-10-CM

## 2019-12-07 DIAGNOSIS — Z86010 Personal history of colonic polyps: Secondary | ICD-10-CM

## 2019-12-07 HISTORY — PX: COLONOSCOPY: SHX174

## 2019-12-07 MED ORDER — SODIUM CHLORIDE 0.9 % IV SOLN: 500.0000 mL | Freq: Once | INTRAVENOUS | Status: DC

## 2019-12-07 NOTE — Op Note (Signed)
[facility] Patient Name: David Moody Procedure Date: 12/07/2019 9:50 AM MRN: [id number] Endoscopist: Milus Banister , MD Age: 64 Referring MD:  Date of Birth: 11/09/1955 Gender: Male Account #: [id number] Procedure:                Colonoscopy Indications:              High risk colon cancer surveillance: Personal                            history of colonic polyps; Colonoscopy 2017 three                            adenomatous polyps removed including two 1-2cm                            pedunculated TVAs Medicines:                Monitored Anesthesia Care Procedure:                Pre-Anesthesia Assessment:                           - Prior to the procedure, a History and Physical                            was performed, and patient medications and                            allergies were reviewed. The patient's tolerance of                            previous anesthesia was also reviewed. The risks                            and benefits of the procedure and the sedation                            options and risks were discussed with the patient.                            All questions were answered, and informed consent                            was obtained. Prior Anticoagulants: The patient has                            taken no previous anticoagulant or antiplatelet                            agents. ASA Grade Assessment: II - A patient with                            mild systemic disease. After reviewing the risks  and benefits, the patient was deemed in                            satisfactory condition to undergo the procedure.                           After obtaining informed consent, the colonoscope                            was passed under direct vision. Throughout the                            procedure, the patient's blood pressure, pulse, and                            oxygen saturations were monitored continuously. The                             Colonoscope was introduced through the anus and                            advanced to the the cecum, identified by                            appendiceal orifice and ileocecal valve. The                            colonoscopy was performed without difficulty. The                            patient tolerated the procedure well. The quality                            of the bowel preparation was good. The ileocecal                            valve, appendiceal orifice, and rectum were                            photographed. Scope In: 10:01:07 AM Scope Out: 10:13:09 AM Scope Withdrawal Time: 0 hours 10 minutes 0 seconds  Total Procedure Duration: 0 hours 12 minutes 2 seconds  Findings:                 Three sessile polyps were found in the sigmoid                            colon and cecum. The polyps were 2 to 7 mm in size.                            These polyps were removed with a cold snare.                            Resection and retrieval were complete.  Multiple small and large-mouthed diverticula were                            found in the left colon.                           The exam was otherwise without abnormality on                            direct and retroflexion views. Complications:            No immediate complications. Estimated blood loss:                            None. Estimated Blood Loss:     Estimated blood loss: none. Impression:               - Three 2 to 7 mm polyps in the sigmoid colon and                            in the cecum, removed with a cold snare. Resected                            and retrieved.                           - Diverticulosis in the left colon.                           - The examination was otherwise normal on direct                            and retroflexion views. Recommendation:           - Patient has a contact number available for                            emergencies.  The signs and symptoms of potential                            delayed complications were discussed with the                            patient. Return to normal activities tomorrow.                            Written discharge instructions were provided to the                            patient.                           - Resume previous diet.                           - Continue present medications.                           -  Await pathology results. Milus Banister, MD 12/07/2019 10:16:02 AM This report has been signed electronically.

## 2019-12-07 NOTE — Progress Notes (Signed)
pt tolerated well. VSS. awake and to recovery. Report given to RN.  

## 2019-12-07 NOTE — Progress Notes (Signed)
Pt's states no medical or surgical changes since previsit or office visit.  Vitals DT 

## 2019-12-07 NOTE — Patient Instructions (Signed)
Handout on polyps, diverticulosis   YOU HAD AN ENDOSCOPIC PROCEDURE TODAY AT [place] [facility]:   Refer to the procedure report that was given to you for any specific questions about what was found during the examination.  If the procedure report does not answer your questions, please call your gastroenterologist to clarify.  If you requested that your care partner not be given the details of your procedure findings, then the procedure report has been included in a sealed envelope for you to review at your convenience later.  YOU SHOULD EXPECT: Some feelings of bloating in the abdomen. Passage of more gas than usual.  Walking can help get rid of the air that was put into your GI tract during the procedure and reduce the bloating. If you had a lower endoscopy (such as a colonoscopy or flexible sigmoidoscopy) you may notice spotting of blood in your stool or on the toilet paper. If you underwent a bowel prep for your procedure, you may not have a normal bowel movement for a few days.  Please Note:  You might notice some irritation and congestion in your nose or some drainage.  This is from the oxygen used during your procedure.  There is no need for concern and it should clear up in a day or so.  SYMPTOMS TO REPORT IMMEDIATELY:   Following lower endoscopy (colonoscopy or flexible sigmoidoscopy):  Excessive amounts of blood in the stool  Significant tenderness or worsening of abdominal pains  Swelling of the abdomen that is new, acute  Fever of 100F or higher   For urgent or emergent issues, a gastroenterologist can be reached at any hour by calling [phone]. Do not use MyChart messaging for urgent concerns.    DIET:  We do recommend a small meal at first, but then you may proceed to your regular diet.  Drink plenty of fluids but you should avoid alcoholic beverages for 24 hours.  ACTIVITY:  You should plan to take it easy for the rest of today and you should NOT DRIVE or use  heavy machinery until tomorrow (because of the sedation medicines used during the test).    FOLLOW UP: Our staff will call the number listed on your records 48-72 hours following your procedure to check on you and address any questions or concerns that you may have regarding the information given to you following your procedure. If we do not reach you, we will leave a message.  We will attempt to reach you two times.  During this call, we will ask if you have developed any symptoms of COVID 19. If you develop any symptoms (ie: fever, flu-like symptoms, shortness of breath, cough etc.) before then, please call [phone].  If you test positive for Covid 19 in the 2 weeks post procedure, please call and report this information to [place].    If any biopsies were taken you will be contacted by phone or by letter within the next 1-3 weeks.  Please call us at [phone] if you have not heard about the biopsies in 3 weeks.    SIGNATURES/CONFIDENTIALITY: You and/or your care partner have signed paperwork which will be entered into your electronic medical record.  These signatures attest to the fact that that the information above on your After Visit Summary has been reviewed and is understood.  Full responsibility of the confidentiality of this discharge information lies with you and/or your care-partner.

## 2019-12-07 NOTE — Progress Notes (Signed)
Called to room to assist during endoscopic procedure.  Patient ID and intended procedure confirmed with present staff. Received instructions for my participation in the procedure from the performing physician.  

## 2019-12-09 ENCOUNTER — Telehealth: Payer: Self-pay | Admitting: *Deleted

## 2019-12-09 NOTE — Telephone Encounter (Signed)
  Follow up Call-  Call back number 12/07/2019  Post procedure Call Back phone  # [phone]  Permission to leave phone message Yes  Some recent data might be hidden     Patient not at # given.  Spoke with his wife. She will let him know we called and to call back with questions or concerns.   Patient questions:  Do you have a fever, pain , or abdominal swelling? No. Pain Score  0 *  Have you tolerated food without any problems? Yes.    Have you been able to return to your normal activities? Yes.    Do you have any questions about your discharge instructions: Diet   No. Medications  No. Follow up visit  No.  Do you have questions or concerns about your Care? No.  Actions: * If pain score is 4 or above: No action needed, pain <4.  1. Have you developed a fever since your procedure? no  2.   Have you had an respiratory symptoms (SOB or cough) since your procedure? no  3.   Have you tested positive for COVID 19 since your procedure no  4.   Have you had any family members/close contacts diagnosed with the COVID 19 since your procedure?  no   If yes to any of these questions please route to Joylene John, RN and Erenest Rasher, RN

## 2019-12-13 ENCOUNTER — Encounter: Payer: Self-pay | Admitting: Gastroenterology

## 2020-08-10 ENCOUNTER — Other Ambulatory Visit: Payer: Medicare Other

## 2020-08-10 DIAGNOSIS — Z20822 Contact with and (suspected) exposure to covid-19: Secondary | ICD-10-CM

## 2020-08-12 LAB — NOVEL CORONAVIRUS, NAA: SARS-CoV-2, NAA: DETECTED — AB

## 2020-08-12 LAB — SARS-COV-2, NAA 2 DAY TAT

## 2020-08-13 ENCOUNTER — Telehealth: Payer: Self-pay

## 2020-08-13 NOTE — Telephone Encounter (Signed)
Called to discuss with patient about COVID-19 symptoms and the use of one of the available treatments for those with mild to moderate Covid symptoms and at a high risk of hospitalization.  Pt appears to qualify for outpatient treatment due to co-morbid conditions and/or a member of an at-risk group in accordance with the FDA Emergency Use Authorization.    Symptom onset: 08/09/20 Vaccinated: Yes Booster? Yes Immunocompromised? No Qualifiers: Age Pt. States he is better, declines any treatment.    David Moody

## 2023-01-01 ENCOUNTER — Encounter: Payer: Self-pay | Admitting: Gastroenterology

## 2023-01-22 ENCOUNTER — Encounter: Payer: Self-pay | Admitting: Gastroenterology

## 2023-02-27 ENCOUNTER — Ambulatory Visit (AMBULATORY_SURGERY_CENTER): Payer: Medicare Other

## 2023-02-27 VITALS — Ht 74.0 in | Wt 212.0 lb

## 2023-02-27 DIAGNOSIS — Z86010 Personal history of colonic polyps: Secondary | ICD-10-CM

## 2023-02-27 MED ORDER — NA SULFATE-K SULFATE-MG SULF 17.5-3.13-1.6 GM/177ML PO SOLN: 1.0000 | Freq: Once | ORAL | 0 refills | Status: AC

## 2023-02-27 NOTE — Progress Notes (Signed)

## 2023-03-13 ENCOUNTER — Encounter: Payer: Self-pay | Admitting: Gastroenterology

## 2023-03-24 ENCOUNTER — Encounter: Payer: Medicare Other | Admitting: Gastroenterology

## 2023-04-07 ENCOUNTER — Ambulatory Visit (AMBULATORY_SURGERY_CENTER): Payer: Medicare Other | Admitting: Gastroenterology

## 2023-04-07 ENCOUNTER — Encounter: Payer: Self-pay | Admitting: Gastroenterology

## 2023-04-07 VITALS — BP 106/47 | HR 66 | Temp 97.6°F | Resp 14 | Ht 74.0 in | Wt 212.0 lb

## 2023-04-07 DIAGNOSIS — Z09 Encounter for follow-up examination after completed treatment for conditions other than malignant neoplasm: Secondary | ICD-10-CM | POA: Diagnosis not present

## 2023-04-07 DIAGNOSIS — Z86010 Personal history of colonic polyps: Secondary | ICD-10-CM | POA: Diagnosis not present

## 2023-04-07 DIAGNOSIS — D123 Benign neoplasm of transverse colon: Secondary | ICD-10-CM | POA: Diagnosis not present

## 2023-04-07 MED ORDER — SODIUM CHLORIDE 0.9 % IV SOLN: 500.0000 mL | INTRAVENOUS | Status: DC

## 2023-04-07 NOTE — Progress Notes (Signed)
Called to room to assist during endoscopic procedure.  Patient ID and intended procedure confirmed with present staff. Received instructions for my participation in the procedure from the performing physician.  

## 2023-04-07 NOTE — Patient Instructions (Addendum)
Resume previous diet Continue present medications Await pathology results  Handouts/information given for polyps, high fiber diet, diverticulosis and hemorrhoids  YOU HAD AN ENDOSCOPIC PROCEDURE TODAY AT THE [facility]:   Refer to the procedure report that was given to you for any specific questions about what was found during the examination.  If the procedure report does not answer your questions, please call your gastroenterologist to clarify.  If you requested that your care partner not be given the details of your procedure findings, then the procedure report has been included in a sealed envelope for you to review at your convenience later.  YOU SHOULD EXPECT: Some feelings of bloating in the abdomen. Passage of more gas than usual.  Walking can help get rid of the air that was put into your GI tract during the procedure and reduce the bloating. If you had a lower endoscopy (such as a colonoscopy or flexible sigmoidoscopy) you may notice spotting of blood in your stool or on the toilet paper. If you underwent a bowel prep for your procedure, you may not have a normal bowel movement for a few days.  Please Note:  You might notice some irritation and congestion in your nose or some drainage.  This is from the oxygen used during your procedure.  There is no need for concern and it should clear up in a day or so.  SYMPTOMS TO REPORT IMMEDIATELY:  Following lower endoscopy (colonoscopy):  Excessive amounts of blood in the stool  Significant tenderness or worsening of abdominal pains  Swelling of the abdomen that is new, acute  Fever of 100F or higher  For urgent or emergent issues, a gastroenterologist can be reached at any hour by calling [phone]. Do not use MyChart messaging for urgent concerns.   DIET:  We do recommend a small meal at first, but then you may proceed to your regular diet.  Drink plenty of fluids but you should avoid alcoholic beverages for 24  hours.  ACTIVITY:  You should plan to take it easy for the rest of today and you should NOT DRIVE or use heavy machinery until tomorrow (because of the sedation medicines used during the test).    FOLLOW UP: Our staff will call the number listed on your records the next business day following your procedure.  We will call around 7:15- 8:00 am to check on you and address any questions or concerns that you may have regarding the information given to you following your procedure. If we do not reach you, we will leave a message.     If any biopsies were taken you will be contacted by phone or by letter within the next 1-3 weeks.  Please call us at [phone] if you have not heard about the biopsies in 3 weeks.    SIGNATURES/CONFIDENTIALITY: You and/or your care partner have signed paperwork which will be entered into your electronic medical record.  These signatures attest to the fact that that the information above on your After Visit Summary has been reviewed and is understood.  Full responsibility of the confidentiality of this discharge information lies with you and/or your care-partner.

## 2023-04-07 NOTE — Progress Notes (Signed)
GASTROENTEROLOGY PROCEDURE H&P NOTE   Primary Care Physician: Farris Has, MD  HPI: David Moody is a 67 y.o. male who presents for surveillance colonoscopy.  Past Medical History:  Diagnosis Date   Allergy    allergic rhinitis   Detached retina, left    Heart murmur    Hx of colonic polyps    Rosacea    Past Surgical History:  Procedure Laterality Date   COLONOSCOPY  12/07/2019   2017-[facility]   SKIN GRAFT  1968   Right arm   Current Outpatient Medications  Medication Sig Dispense Refill   aspirin 325 MG tablet Take 325 mg by mouth as needed.     No current facility-administered medications for this visit.    Current Outpatient Medications:    aspirin 325 MG tablet, Take 325 mg by mouth as needed., Disp: , Rfl:  No Known Allergies Family History  Problem Relation Age of Onset   Alcohol abuse Mother    Cancer Mother        lung   Diabetes Father    Heart disease Father    Prostate cancer Neg Hx    Colon cancer Neg Hx    Esophageal cancer Neg Hx    Stomach cancer Neg Hx    Rectal cancer Neg Hx    Colon polyps Neg Hx    Social History   Socioeconomic History   Marital status: Married    Spouse name: Not on file   Number of children: Not on file   Years of education: Not on file   Highest education level: Not on file  Occupational History   Occupation: Sowing Sports administrator: UNEMPLOYED  Tobacco Use   Smoking status: Former    Current packs/day: 0.00    Types: Cigarettes    Quit date: 07/14/1980    Years since quitting: 42.7   Smokeless tobacco: Never  Vaping Use   Vaping status: Never Used  Substance and Sexual Activity   Alcohol use: Yes    Comment: 6 pack a week or less   Drug use: No   Sexual activity: Not on file  Other Topics Concern   Not on file  Social History Narrative   Education:  2 years college   [employer] fan   Works at [employer] but may be laid off in 2013   Married 1992, no kids   Social Determinants of Research scientist (physical sciences) Strain: Not on file  Food Insecurity: Not on file  Transportation Needs: Not on file  Physical Activity: Not on file  Stress: Not on file  Social Connections: Not on file  Intimate Partner Violence: Not on file    Physical Exam: There were no vitals filed for this visit. There is no height or weight on file to calculate BMI. GEN: NAD EYE: Sclerae anicteric ENT: MMM CV: Non-tachycardic GI: Soft, NT/ND NEURO:  Alert & Oriented x 3  Lab Results: No results for input(s): "WBC", "HGB", "HCT", "PLT" in the last 72 hours. BMET No results for input(s): "NA", "K", "CL", "CO2", "GLUCOSE", "BUN", "CREATININE", "CALCIUM" in the last 72 hours. LFT No results for input(s): "PROT", "ALBUMIN", "AST", "ALT", "ALKPHOS", "BILITOT", "BILIDIR", "IBILI" in the last 72 hours. PT/INR No results for input(s): "LABPROT", "INR" in the last 72 hours.   Impression / Plan: This is a 67 y.o.male who presents for surveillance colonoscopy.  The risks and benefits of endoscopic evaluation/treatment were discussed with the patient and/or family; these include but  are not limited to the risk of perforation, infection, bleeding, missed lesions, lack of diagnosis, severe illness requiring hospitalization, as well as anesthesia and sedation related illnesses.  The patient's history has been reviewed, patient examined, no change in status, and deemed stable for procedure.  The patient and/or family is agreeable to proceed.    Corliss Parish, MD [facility] Advanced [facility]oscopy Office # [phone]

## 2023-04-07 NOTE — Op Note (Signed)
[facility] Patient Name: Darlene Bone Procedure Date: 04/07/2023 8:38 AM MRN: [id number] Endoscopist: Corliss Parish , MD, [id number] Age: 67 Referring MD:  Date of Birth: 08-17-1955 Gender: Male Account #: [id number] Procedure:                Colonoscopy Indications:              Surveillance: Personal history of adenomatous                            polyps on last colonoscopy 3 years ago Medicines:                Monitored Anesthesia Care Procedure:                Pre-Anesthesia Assessment:                           - Prior to the procedure, a History and Physical                            was performed, and patient medications and                            allergies were reviewed. The patient's tolerance of                            previous anesthesia was also reviewed. The risks                            and benefits of the procedure and the sedation                            options and risks were discussed with the patient.                            All questions were answered, and informed consent                            was obtained. Prior Anticoagulants: The patient has                            taken no anticoagulant or antiplatelet agents                            except for aspirin. ASA Grade Assessment: II - A                            patient with mild systemic disease. After reviewing                            the risks and benefits, the patient was deemed in                            satisfactory condition to undergo the procedure.  After obtaining informed consent, the colonoscope                            was passed under direct vision. Throughout the                            procedure, the patient's blood pressure, pulse, and                            oxygen saturations were monitored continuously. The                            Olympus Scope SN: [id number] was introduced through                             the anus and advanced to the 3 cm into the ileum.                            The colonoscopy was performed without difficulty.                            The patient tolerated the procedure. The quality of                            the bowel preparation was adequate. The terminal                            ileum, ileocecal valve, appendiceal orifice, and                            rectum were photographed. Scope In: 8:43:28 AM Scope Out: 8:58:58 AM Scope Withdrawal Time: 0 hours 11 minutes 50 seconds  Total Procedure Duration: 0 hours 15 minutes 30 seconds  Findings:                 The digital rectal exam findings include                            hemorrhoids. Pertinent negatives include no                            palpable rectal lesions.                           The terminal ileum and ileocecal valve appeared                            normal.                           Two sessile polyps were found in the transverse                            colon and ascending colon. The polyps were 2 to 3  mm in size. These polyps were removed with a cold                            snare. Resection and retrieval were complete.                           Multiple small-mouthed diverticula were found in                            the sigmoid colon and ascendning colon and cecum.                           Normal mucosa was found in the entire colon                            otherwise.                           Non-bleeding non-thrombosed external and internal                            hemorrhoids were found during retroflexion, during                            perianal exam and during digital exam. The                            hemorrhoids were Grade II (internal hemorrhoids                            that prolapse but reduce spontaneously). Complications:            No immediate complications. Estimated Blood Loss:     Estimated blood loss was minimal. Impression:                - Hemorrhoids found on digital rectal exam.                           - The examined portion of the ileum was normal.                           - Two 2 to 3 mm polyps in the transverse colon and                            in the ascending colon, removed with a cold snare.                            Resected and retrieved.                           - Diverticulosis in the sigmoid, ascending, cecum.                           - Normal mucosa in the entire examined colon  otherwise.                           - Non-bleeding non-thrombosed external and internal                            hemorrhoids. Recommendation:           - The patient will be observed post-procedure,                            until all discharge criteria are met.                           - Discharge patient to home.                           - Patient has a contact number available for                            emergencies. The signs and symptoms of potential                            delayed complications were discussed with the                            patient. Return to normal activities tomorrow.                            Written discharge instructions were provided to the                            patient.                           - High fiber diet.                           - Use FiberCon 1-2 tablets PO daily.                           - Continue present medications.                           - Await pathology results.                           - Repeat colonoscopy in 5-7 years for surveillance                            based on pathology results and previous history of                            adenomas.                           - The findings and recommendations were discussed  with the patient.                           - The findings and recommendations were discussed                            with the patient's family. Corliss Parish, MD 04/07/2023 9:03:38 AM

## 2023-04-07 NOTE — Progress Notes (Signed)
Report to PACU, RN, vss, BBS= Clear.  

## 2023-04-08 ENCOUNTER — Telehealth: Payer: Self-pay

## 2023-04-08 NOTE — Telephone Encounter (Signed)
  Follow up Call-     04/07/2023    8:03 AM  Call back number  Post procedure Call Back phone  # [phone]  Permission to leave phone message Yes     Patient questions:  Do you have a fever, pain , or abdominal swelling? No. Pain Score  0 *  Have you tolerated food without any problems? Yes.    Have you been able to return to your normal activities? Yes.    Do you have any questions about your discharge instructions: Diet   No. Medications  No. Follow up visit  No.  Do you have questions or concerns about your Care? No.  Actions: * If pain score is 4 or above: No action needed, pain <4.

## 2023-04-09 LAB — SURGICAL PATHOLOGY

## 2023-04-10 ENCOUNTER — Encounter: Payer: Self-pay | Admitting: Gastroenterology
